# Patient Record
Sex: Male | Born: 1995 | Race: White | Hispanic: No | Marital: Single | Smoking: Current some day smoker
Health system: Southern US, Community
[De-identification: ages and names within clinical notes are randomized; demographics above are authoritative.]

## PROBLEM LIST (undated history)

## (undated) DIAGNOSIS — N5089 Other specified disorders of the male genital organs: Secondary | ICD-10-CM

## (undated) DIAGNOSIS — D72819 Decreased white blood cell count, unspecified: Secondary | ICD-10-CM

## (undated) HISTORY — DX: Other specified disorders of the male genital organs: N50.89

## (undated) HISTORY — DX: Decreased white blood cell count, unspecified: D72.819

---
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
## 2016-06-16 ENCOUNTER — Encounter: Payer: Self-pay | Admitting: Emergency Medicine

## 2016-06-16 ENCOUNTER — Ambulatory Visit
Admission: EM | Admit: 2016-06-16 | Discharge: 2016-06-16 | Disposition: A | Discharge status: Home / Self Care | Payer: Federal, State, Local not specified - PPO | Attending: Family Medicine | Admitting: Family Medicine

## 2016-06-16 DIAGNOSIS — J111 Influenza due to unidentified influenza virus with other respiratory manifestations: Secondary | ICD-10-CM | POA: Diagnosis not present

## 2016-06-16 MED ORDER — HYDROCOD POLST-CPM POLST ER 10-8 MG/5ML PO SUER: 5.0000 mL | Freq: Every evening | ORAL | 0 refills | Status: DC | PRN

## 2016-06-16 MED ORDER — ONDANSETRON HCL 8 MG PO TABS: 8.0000 mg | ORAL_TABLET | Freq: Three times a day (TID) | ORAL | 0 refills | Status: DC | PRN

## 2016-06-16 NOTE — Discharge Instructions (Signed)
Recommend take Zofran every 8 hours as needed for nausea. Increase fluid intake to help maintain hydration. Continue Ibuprofen 600mg  every 6 hours as needed for fever and body aches. May take Tussionex 1 teaspoon at night to help with cough and sleep. Rest. Follow-up with a primary care provider in 3 days if not improving or go to ER if symptoms worsen.

## 2016-06-16 NOTE — ED Provider Notes (Signed)
CSN: [id number]     Arrival date & time 06/16/16  1010 History   First MD Initiated Contact with Patient 06/16/16 1059     Chief Complaint  Patient presents with  . Influenza   (Consider location/radiation/quality/duration/timing/severity/associated sxs/prior Treatment) 21 year old male accompanied by a caregiver with fever, body aches, nasal congestion, cough, nausea and vomiting that started about 4 days ago. Also feeling dizzy and having difficulty keeping any fluids down due to nausea. Unable to sleep at night due to cough. Did have the flu vaccine. Has taken Ibuprofen, Theraflu, Dayquil, Nyquil with some relief. No other family members ill. No chronic health issues. Takes no daily medication.    The history is provided by the patient and a caregiver.    History reviewed. No pertinent past medical history. History reviewed. No pertinent surgical history. No family history on file. Social History  Substance Use Topics  . Smoking status: Never Smoker  . Smokeless tobacco: Never Used  . Alcohol use No    Review of Systems  Constitutional: Positive for appetite change, chills, fatigue and fever.  HENT: Positive for congestion, rhinorrhea and sore throat. Negative for ear pain, sinus pain and sinus pressure.   Eyes: Negative for discharge.  Respiratory: Positive for cough. Negative for chest tightness, shortness of breath and wheezing.   Cardiovascular: Negative for chest pain.  Gastrointestinal: Positive for nausea and vomiting. Negative for abdominal pain and diarrhea.  Musculoskeletal: Positive for arthralgias and myalgias. Negative for neck pain and neck stiffness.  Skin: Negative for rash.  Neurological: Positive for dizziness, light-headedness and headaches. Negative for syncope and weakness.  Hematological: Negative for adenopathy.    Allergies  Patient has no known allergies.  Home Medications   Prior to Admission medications   Medication Sig Start Date End Date  Taking? Authorizing Provider  chlorpheniramine-HYDROcodone (TUSSIONEX PENNKINETIC ER) 10-8 MG/5ML SUER Take 5 mLs by mouth at bedtime as needed for cough. 06/16/16   Sudie GrumblingAnn Berry Bodin Gorka, NP  ondansetron (ZOFRAN) 8 MG tablet Take 1 tablet (8 mg total) by mouth every 8 (eight) hours as needed for nausea or vomiting. 06/16/16   Sudie GrumblingAnn Berry Tykiera Raven, NP   Meds Ordered and Administered this Visit  Medications - No data to display  BP 124/68 (BP Location: Left Arm)   Pulse (!) 110   Temp (!) 100.7 F (38.2 C) (Oral)   Resp 16   Ht 5\' 11"  (1.803 m)   Wt 175 lb (79.4 kg)   SpO2 98%   BMI 24.41 kg/m  No data found.   Physical Exam  Constitutional: He is oriented to person, place, and time. He appears well-developed and well-nourished. He has a sickly appearance. No distress.  HENT:  Head: Normocephalic and atraumatic.  Right Ear: Hearing, tympanic membrane, external ear and ear canal normal.  Left Ear: Hearing, tympanic membrane, external ear and ear canal normal.  Nose: Mucosal edema and rhinorrhea present. Right sinus exhibits no maxillary sinus tenderness and no frontal sinus tenderness. Left sinus exhibits no maxillary sinus tenderness and no frontal sinus tenderness.  Mouth/Throat: Uvula is midline and mucous membranes are normal. Posterior oropharyngeal erythema present.  Neck: Normal range of motion. Neck supple.  Cardiovascular: Regular rhythm and normal heart sounds.  Tachycardia present.   Pulmonary/Chest: Effort normal and breath sounds normal. No respiratory distress. He has no decreased breath sounds. He has no wheezes. He has no rhonchi.  Lymphadenopathy:    He has no cervical adenopathy.  Neurological: He is alert  and oriented to person, place, and time.  Skin: Skin is warm. Capillary refill takes less than 2 seconds. He is diaphoretic.  Psychiatric: He has a normal mood and affect. His behavior is normal. Judgment and thought content normal.    Urgent Care Course     Procedures  (including critical care time)  Labs Review Labs Reviewed - No data to display  Imaging Review No results found.   Visual Acuity Review  Right Eye Distance:   Left Eye Distance:   Bilateral Distance:    Right Eye Near:   Left Eye Near:    Bilateral Near:         MDM   1. Influenza    Discussed with patient and his caregiver that he most likely has influenza. Discussed Tamiflu and patient declined Rx. Recommend take Zofran every 8 hours as needed for nausea. Increase fluid intake to help maintain hydration. Continue Ibuprofen 600mg  every 6 hours as needed for fever and body aches. May take Tussionex 1 teaspoon at night to help with cough and sleep. [place] Controlled Substance Registry Reviewed- no active Rx in the past 3 months. Rest. Note written for school. Follow-up with a primary care provider in 3 days if not improving or go to ER if symptoms worsen.      Sudie Grumbling, NP 06/16/16 2019

## 2016-06-16 NOTE — ED Triage Notes (Signed)
Fly symptoms for 4 days. Fever, chills, cough, body aches and vomiting.

## 2020-03-01 ENCOUNTER — Other Ambulatory Visit: Payer: Self-pay | Admitting: Gerontology

## 2020-03-01 ENCOUNTER — Other Ambulatory Visit (HOSPITAL_COMMUNITY): Payer: Self-pay | Admitting: Gerontology

## 2020-03-01 DIAGNOSIS — N5089 Other specified disorders of the male genital organs: Secondary | ICD-10-CM

## 2020-03-09 ENCOUNTER — Other Ambulatory Visit: Payer: Self-pay

## 2020-03-09 ENCOUNTER — Encounter (INDEPENDENT_AMBULATORY_CARE_PROVIDER_SITE_OTHER): Payer: Self-pay

## 2020-03-09 ENCOUNTER — Ambulatory Visit
Admission: RE | Admit: 2020-03-09 | Discharge: 2020-03-09 | Disposition: A | Discharge status: Home / Self Care | Payer: Federal, State, Local not specified - PPO | Source: Ambulatory Visit | Attending: Gerontology | Admitting: Gerontology

## 2020-03-09 DIAGNOSIS — N5089 Other specified disorders of the male genital organs: Secondary | ICD-10-CM | POA: Diagnosis not present

## 2021-06-29 ENCOUNTER — Encounter: Payer: Self-pay | Admitting: *Deleted

## 2021-07-04 ENCOUNTER — Telehealth: Payer: Self-pay

## 2021-07-04 NOTE — Telephone Encounter (Signed)
Called and left vm with patient in reference to NP appointment for 32/2023 at 9:30 am. Advised to call our office if any questions he may have prior to appt.  ?

## 2021-07-05 ENCOUNTER — Encounter: Payer: Self-pay | Admitting: Oncology

## 2021-07-05 ENCOUNTER — Inpatient Hospital Stay: Payer: Federal, State, Local not specified - PPO

## 2021-07-05 ENCOUNTER — Inpatient Hospital Stay: Payer: Federal, State, Local not specified - PPO | Attending: Oncology | Admitting: Oncology

## 2021-07-05 ENCOUNTER — Other Ambulatory Visit: Payer: Self-pay

## 2021-07-05 VITALS — BP 143/95 | HR 90 | Temp 96.5°F | Ht 70.0 in | Wt 202.0 lb

## 2021-07-05 DIAGNOSIS — F17210 Nicotine dependence, cigarettes, uncomplicated: Secondary | ICD-10-CM | POA: Insufficient documentation

## 2021-07-05 DIAGNOSIS — D72818 Other decreased white blood cell count: Secondary | ICD-10-CM

## 2021-07-05 DIAGNOSIS — D72819 Decreased white blood cell count, unspecified: Secondary | ICD-10-CM | POA: Insufficient documentation

## 2021-07-05 LAB — CBC WITH DIFFERENTIAL/PLATELET
Abs Immature Granulocytes: 0 10*3/uL (ref 0.00–0.07)
Basophils Absolute: 0 10*3/uL (ref 0.0–0.1)
Basophils Relative: 1 %
Eosinophils Absolute: 0 10*3/uL (ref 0.0–0.5)
Eosinophils Relative: 1 %
HCT: 43.8 % (ref 39.0–52.0)
Hemoglobin: 15.4 g/dL (ref 13.0–17.0)
Immature Granulocytes: 0 %
Lymphocytes Relative: 31 %
Lymphs Abs: 0.8 10*3/uL (ref 0.7–4.0)
MCH: 32.4 pg (ref 26.0–34.0)
MCHC: 35.2 g/dL (ref 30.0–36.0)
MCV: 92 fL (ref 80.0–100.0)
Monocytes Absolute: 0.2 10*3/uL (ref 0.1–1.0)
Monocytes Relative: 8 %
Neutro Abs: 1.5 10*3/uL — ABNORMAL LOW (ref 1.7–7.7)
Neutrophils Relative %: 59 %
Platelets: 236 10*3/uL (ref 150–400)
RBC: 4.76 MIL/uL (ref 4.22–5.81)
RDW: 11.1 % — ABNORMAL LOW (ref 11.5–15.5)
WBC: 2.5 10*3/uL — ABNORMAL LOW (ref 4.0–10.5)
nRBC: 0 % (ref 0.0–0.2)

## 2021-07-05 LAB — FOLATE: Folate: 12.7 ng/mL (ref >5.9)

## 2021-07-05 LAB — HEPATITIS PANEL, ACUTE
HCV Ab: NONREACTIVE
Hep A IgM: NONREACTIVE
Hep B C IgM: NONREACTIVE
Hepatitis B Surface Ag: NONREACTIVE

## 2021-07-05 LAB — TECHNOLOGIST SMEAR REVIEW
RBC MORPHOLOGY: NORMAL
Tech Review: NORMAL
WBC MORPHOLOGY: NORMAL

## 2021-07-05 LAB — LACTATE DEHYDROGENASE: LDH: 135 U/L (ref 98–192)

## 2021-07-05 LAB — VITAMIN B12: Vitamin B-12: 190 pg/mL (ref 180–914)

## 2021-07-05 NOTE — Progress Notes (Signed)
?Hematology/Oncology Consult note ?[phone] [phone] ?  ? ?   ? ? ?Patient Care Team: ?Sofie Hartigan, MD as PCP - General (Family Medicine) ?Earlie Server, MD as Consulting Physician (Hematology and Oncology) ? ?REFERRING PROVIDER: ?Latanya Maudlin, NP  ?CHIEF COMPLAINTS/REASON FOR VISIT:  ?Evaluation of leukopenia ? ?HISTORY OF PRESENTING ILLNESS:  ? ?Nicholas Arias is a  26 y.o.  male with PMH listed below was seen in consultation at the request of  Latanya Maudlin, NP  for evaluation of leukopenia ? ?06/20/2021, CBC showed a total white count of 3.3.  Normal differential.  Normal hemoglobin 15.5, normal platelet counts. ?Reviewed his previous blood work through care everywhere ?He had CBC done a year ago which showed white count of 3.4.  Normal differential. ?No other labs available in current EMR. ?Denies weight loss, fever, chills, fatigue, night sweats.   ?He drinks alcohol occasionally.  Does not take any medication or over-the-counter supplements. ? ?Review of Systems  ?Constitutional:  Negative for appetite change, chills, fatigue, fever and unexpected weight change.  ?HENT:   Negative for hearing loss and voice change.   ?Eyes:  Negative for eye problems and icterus.  ?Respiratory:  Negative for chest tightness, cough and shortness of breath.   ?Cardiovascular:  Negative for chest pain and leg swelling.  ?Gastrointestinal:  Negative for abdominal distention and abdominal pain.  ?Endocrine: Negative for hot flashes.  ?Genitourinary:  Negative for difficulty urinating, dysuria and frequency.   ?Musculoskeletal:  Negative for arthralgias.  ?Skin:  Negative for itching and rash.  ?Neurological:  Negative for light-headedness and numbness.  ?Hematological:  Negative for adenopathy. Does not bruise/bleed easily.  ?Psychiatric/Behavioral:  Negative for confusion.   ? ?MEDICAL HISTORY:  ?Past Medical History:  ?Diagnosis Date  ? Labral tear of shoulder, right, initial encounter  12/05/2014  ? Leukopenia   ? 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 Testicular mass   ? Tight posterior capsule of right shoulder 12/05/2014  ? Traumatic rupture of right ulnar collateral ligament 12/05/2014  ? ? ?SURGICAL HISTORY: ?No past surgical history on file. ? ?SOCIAL HISTORY: ?Social History  ? ?Socioeconomic History  ? Marital status: Single  ?  Spouse name: Not on file  ? Number of children: Not on file  ? Years of education: Not on file  ? Highest education level: Not on file  ?Occupational History  ? Not on file  ?Tobacco Use  ? Smoking status: Some Days  ?  Types: Cigarettes  ? Smokeless tobacco: Never  ?Vaping Use  ? Vaping Use: Never used  ?Substance and Sexual Activity  ? Alcohol use: Yes  ?  Alcohol/week: 3.0 - 4.0 standard drinks  ?  Types: 3 - 4 Cans of beer per week  ?  Comment: 3-4 beer on weekends  ? Drug use: Never  ? Sexual activity: Yes  ?Other Topics Concern  ? Not on file  ?Social History Narrative  ? Not on file  ? ?Social Determinants of Health  ? ?Financial Resource Strain: Not on file  ?Food Insecurity: Not on file  ?Transportation Needs: Not on file  ?Physical Activity: Not on file  ?Stress: Not on file  ?Social Connections: Not on file  ?Intimate Partner Violence: Not on file  ? ? ?FAMILY HISTORY: ?Family History  ?Problem Relation Age of Onset  ? Heart attack Maternal Grandmother   ? Heart attack Paternal Grandfather   ? ? ?ALLERGIES:  has No Known Allergies. ? ?MEDICATIONS:  ?No current  outpatient medications on file.  ? ?No current facility-administered medications for this visit.  ? ? ? ?PHYSICAL EXAMINATION: ?ECOG PERFORMANCE STATUS: 0 - Asymptomatic ?Vitals:  ? 07/05/21 0927  ?BP: (!) 143/95  ?Pulse: 90  ?Temp: (!) 96.5 ?F (35.8 ?C)  ? ?Filed Weights  ? 07/05/21 0927  ?Weight: 202 lb (91.6 kg)  ? ? ?Physical Exam ?Constitutional:   ?   General: He is not in acute distress. ?HENT:  ?   Head: Normocephalic and atraumatic.  ?Eyes:  ?   General: No scleral icterus. ?Cardiovascular:  ?    Rate and Rhythm: Normal rate and regular rhythm.  ?   Heart sounds: Normal heart sounds.  ?Pulmonary:  ?   Effort: Pulmonary effort is normal. No respiratory distress.  ?   Breath sounds: No wheezing.  ?Abdominal:  ?   General: Bowel sounds are normal. There is no distension.  ?   Palpations: Abdomen is soft.  ?Musculoskeletal:     ?   General: No deformity. Normal range of motion.  ?   Cervical back: Normal range of motion and neck supple.  ?Skin: ?   General: Skin is warm and dry.  ?   Findings: No erythema or rash.  ?Neurological:  ?   Mental Status: He is alert and oriented to person, place, and time. Mental status is at baseline.  ?   Cranial Nerves: No cranial nerve deficit.  ?   Coordination: Coordination normal.  ?Psychiatric:     ?   Mood and Affect: Mood normal.  ? ? ?LABORATORY DATA:  ?I have reviewed the data as listed ?Lab Results  ?Component Value Date  ? WBC 2.5 (L) 07/05/2021  ? HGB 15.4 07/05/2021  ? HCT 43.8 07/05/2021  ? MCV 92.0 07/05/2021  ? PLT 236 07/05/2021  ? ?No results for input(s): NA, K, CL, CO2, GLUCOSE, BUN, CREATININE, CALCIUM, GFRNONAA, GFRAA, PROT, ALBUMIN, AST, ALT, ALKPHOS, BILITOT, BILIDIR, IBILI in the last 8760 hours. ?Iron/TIBC/Ferritin/ %Sat ?No results found for: IRON, TIBC, FERRITIN, IRONPCTSAT  ? ? ?RADIOGRAPHIC STUDIES: ?I have personally reviewed the radiological images as listed and agreed with the findings in the report. ?No results found. ? ? ? ?ASSESSMENT & PLAN:  ?1. Other decreased white blood cell (WBC) count   ? ?#Chronic leukopenia, mild.  Patient is asymptomatic.  No frequent infections. ?Check CBC, smear, LDH, hepatitis panel, HIV, flow cytometry, SPEP, light chain, B12, folate. ? ?Orders Placed This Encounter  ?Procedures  ? Folate  ?  Standing Status:   Future  ?  Number of Occurrences:   1  ?  Standing Expiration Date:   07/06/2022  ? Vitamin B12  ?  Standing Status:   Future  ?  Number of Occurrences:   1  ?  Standing Expiration Date:   07/06/2022  ?  Technologist smear review  ?  Standing Status:   Future  ?  Number of Occurrences:   1  ?  Standing Expiration Date:   07/06/2022  ? CBC with Differential/Platelet  ?  Standing Status:   Future  ?  Number of Occurrences:   1  ?  Standing Expiration Date:   07/06/2022  ? Multiple Myeloma Panel (SPEP&IFE w/QIG)  ?  Standing Status:   Future  ?  Number of Occurrences:   1  ?  Standing Expiration Date:   07/06/2022  ? Kappa/lambda light chains  ?  Standing Status:   Future  ?  Number of Occurrences:  1  ?  Standing Expiration Date:   07/06/2022  ? Flow cytometry panel-leukemia/lymphoma work-up  ?  Standing Status:   Future  ?  Number of Occurrences:   1  ?  Standing Expiration Date:   07/06/2022  ? HIV Antibody (routine testing w rflx)  ?  Standing Status:   Future  ?  Number of Occurrences:   1  ?  Standing Expiration Date:   07/06/2022  ? Hepatitis panel, acute  ?  Standing Status:   Future  ?  Number of Occurrences:   1  ?  Standing Expiration Date:   07/06/2022  ? Lactate dehydrogenase  ?  Standing Status:   Future  ?  Number of Occurrences:   1  ?  Standing Expiration Date:   07/06/2022  ?  ?All questions were answered. The patient knows to call the clinic with any problems questions or concerns. ? ?cc ?Latanya Maudlin, NP  ? ? ?Return of visit: TBD ?Thank you for this kind referral and the opportunity to participate in the care of this patient. A copy of today's note is routed to referring provider  ? ?Earlie Server, MD, PhD ?[facility] Hematology Oncology ?07/05/2021 ? ? ?

## 2021-07-06 LAB — KAPPA/LAMBDA LIGHT CHAINS
Kappa free light chain: 13.3 mg/L (ref 3.3–19.4)
Kappa, lambda light chain ratio: 1.39 (ref 0.26–1.65)
Lambda free light chains: 9.6 mg/L (ref 5.7–26.3)

## 2021-07-07 LAB — HIV ANTIBODY (ROUTINE TESTING W REFLEX): HIV Screen 4th Generation wRfx: NONREACTIVE

## 2021-07-09 LAB — MULTIPLE MYELOMA PANEL, SERUM
Albumin SerPl Elph-Mcnc: 4.5 g/dL — ABNORMAL HIGH (ref 2.9–4.4)
Albumin/Glob SerPl: 1.6 (ref 0.7–1.7)
Alpha 1: 0.2 g/dL (ref 0.0–0.4)
Alpha2 Glob SerPl Elph-Mcnc: 0.6 g/dL (ref 0.4–1.0)
B-Globulin SerPl Elph-Mcnc: 1 g/dL (ref 0.7–1.3)
Gamma Glob SerPl Elph-Mcnc: 1.1 g/dL (ref 0.4–1.8)
Globulin, Total: 2.9 g/dL (ref 2.2–3.9)
IgA: 274 mg/dL (ref 90–386)
IgG (Immunoglobin G), Serum: 975 mg/dL (ref 603–1613)
IgM (Immunoglobulin M), Srm: 95 mg/dL (ref 20–172)
Total Protein ELP: 7.4 g/dL (ref 6.0–8.5)

## 2021-07-09 LAB — COMP PANEL: LEUKEMIA/LYMPHOMA

## 2021-07-10 ENCOUNTER — Telehealth: Payer: Self-pay | Admitting: *Deleted

## 2021-07-10 ENCOUNTER — Other Ambulatory Visit: Payer: Self-pay

## 2021-07-10 DIAGNOSIS — D72818 Other decreased white blood cell count: Secondary | ICD-10-CM

## 2021-07-10 NOTE — Telephone Encounter (Signed)
Patient called asking about lab results we went over results and I told him someone would call him back told him know if and when he needs to return. He has no follow up appointment as physician was TBD at last visit   Multiple Myeloma Panel (SPEP&IFE w/QIG) Order: [id number] Status: Edited Result - FINAL    Visible to patient: Yes (seen)    Next appt: None    Dx: Other decreased white blood cell (WBC...    0 Result Notes     Component Ref Range & Units 5 d ago  IgG (Immunoglobin G), Serum 603 - 1,613 mg/dL 975   IgA 90 - 386 mg/dL 274   IgM (Immunoglobulin M), Srm 20 - 172 mg/dL 95   Total Protein ELP 6.0 - 8.5 g/dL 7.4 VC   Albumin SerPl Elph-Mcnc 2.9 - 4.4 g/dL 4.5 High  VC   Alpha 1 0.0 - 0.4 g/dL 0.2 VC   Alpha2 Glob SerPl Elph-Mcnc 0.4 - 1.0 g/dL 0.6 VC   B-Globulin SerPl Elph-Mcnc 0.7 - 1.3 g/dL 1.0 VC   Gamma Glob SerPl Elph-Mcnc 0.4 - 1.8 g/dL 1.1 VC   M Protein SerPl Elph-Mcnc Not Observed g/dL Not Observed VC   Globulin, Total 2.2 - 3.9 g/dL 2.9 VC   Albumin/Glob SerPl 0.7 - 1.7 1.6 VC   IFE 1  Comment VC   Comment: (NOTE)  The immunofixation pattern appears unremarkable. Evidence of  monoclonal protein is not apparent.   Please Note  Comment VC   Comment: (NOTE)  Protein electrophoresis scan will follow via computer, mail, or  courier delivery.  Performed At: [facility]  [place], [place] [id number]  Rush Farmer MD [id number]   Resulting Agency  [facility]         Specimen Collected: 07/05/21 09:56 Last Resulted: 07/09/21 14:36     View Encounter Conversation      VC=Value has a corrected status       Result Care Coordination   Patient Communication   Add Comments   Seen Back to Top       Other Results from 07/05/2021  Lactate dehydrogenase Order: [id number] Status: Final result    Visible to patient: Yes (seen)    Next appt: None    Dx: Other decreased white blood cell (WBC...    0 Result Notes     Component Ref  Range & Units 5 d ago  LDH 98 - 192 U/L 135   Comment: Performed at [facility], [address] [place], [place] [address]  Resulting Agency  [facility]         Specimen Collected: 07/05/21 09:56 Last Resulted: 07/05/21 10:21     View Encounter Conversation        Result Care Coordination   Patient Communication   Add Comments   Seen Back to Top         Hepatitis panel, acute Order: [id number] Status: Final result    Visible to patient: Yes (seen)    Next appt: None    Dx: Other decreased white blood cell (WBC...    0 Result Notes   1 HM Topic     Component Ref Range & Units 5 d ago  Hepatitis B Surface Ag NON REACTIVE NON REACTIVE   HCV Ab NON REACTIVE NON REACTIVE   Comment: (NOTE)  Nonreactive HCV antibody screen is consistent with no HCV infections,  unless recent infection is suspected or other evidence exists  to  indicate HCV infection.    Hep A IgM NON REACTIVE NON REACTIVE   Hep B C IgM NON REACTIVE NON REACTIVE   Comment: Performed at [facility], [place] [address], [place], [place] [address]  Resulting Agency  [facility]         Specimen Collected: 07/05/21 09:56 Last Resulted: 07/05/21 18:07     View Encounter Conversation        Result Care Coordination   Patient Communication   Add Comments   Seen Back to Top      Satisfied Health Maintenance Topics     Back to Top Hepatitis C Screening   Completed  Address Topic         HIV Antibody (routine testing w rflx) Order: [id number] Status: Final result    Visible to patient: Yes (seen)    Next appt: None    Dx: Other decreased white blood cell (WBC...    0 Result Notes   1 HM Topic     Component Ref Range & Units 5 d ago  HIV Screen 4th Generation wRfx Non Reactive Non Reactive   Comment: Performed at [facility], [place] [address] [place], [place] [address]  Resulting Agency  [facility]         Specimen Collected: 07/05/21 09:56 Last Resulted: 07/07/21  19:16    View Encounter Conversation        Result Care Coordination   Patient Communication   Add Comments   Seen Back to Top      Satisfied Health Maintenance Topics     Back to Top HIV Screening   Completed  Address Topic         Flow cytometry panel-leukemia/lymphoma work-up Order: [id number] Status: Edited Result - FINAL    Visible to patient: Yes (seen)    Next appt: None    Dx: Other decreased white blood cell (WBC...    0 Result Notes    Component 5 d ago  PATH INTERP XXX-IMP Comment   Comment: No significant immunophenotypic abnormality detected  CLINICAL INFO Comment VC   Comment: (NOTE)  Accompanying CBC dated 07/05/2021 shows: WBC count 2.5, Neu 1.5, Lym  0.8, Mon  0.2.   Specimen Type Comment   Comment: Peripheral blood  ASSESSMENT OF LEUKOCYTES Comment   Comment: (NOTE)  No monoclonal B cell population is detected. kappa:lambda ratio 1.6  There is no loss of, or aberrant expression of, the pan T cell  antigens to  suggest a neoplastic T cell process.  CD4:CD8 ratio 1.9  No circulating blasts are detected.  There is no immunophenotypic evidence of abnormal myeloid maturation.  Analysis of the leukocyte population shows: granulocytes 67%,  monocytes 5%,  lymphocytes 28%, blasts <0.1%, B cells 5%, T cells 20%, NK cells 3%.   % Viable Cells Comment VC   Comment: 94%  ANALYSIS AND GATING STRATEGY Comment   Comment: 8 color analysis with CD45/SSC gating  IMMUNOPHENOTYPING STUDY Comment   Comment: (NOTE)  CD2       Normal         CD3       Normal  CD4       Normal         CD5       Normal  CD7       Normal         CD8       Normal  CD10  Normal         CD11b     Normal  CD13      Normal         CD14      Normal  CD16      Normal         CD19      Normal  CD20      Normal         CD33      Normal  CD34      Normal         CD38      Normal  CD45      Normal         CD56      Normal  CD57      Normal         CD117     Normal  HLA-DR    Normal          KAPPA     Normal  LAMBDA    Normal         CD64      Normal   PATHOLOGIST NAME Comment   Comment: Dorita Sciara, M.D.  COMMENT: Comment VC   Comment: (NOTE)  Each antibody in this assay was utilized to assess for potential  abnormalities of studied cell populations or to characterize  identified abnormalities.  This test was developed and its performance characteristics  determined by [facility].  It has not been cleared or approved by the  U.S. Food and Drug Administration.  The FDA has determined that such clearance or approval is not  necessary. This test is used for clinical purposes.  It should not  be regarded as investigational or for research.  Performed At: [facility]  [address] [place], [place] [id number]  Maurine Simmering MDPhD [id number]  Performed At: [facility]  [address] [place], [place] [id number]  Maurine Simmering MDPhD [id number]   Resulting Agency [facility]         Specimen Collected: 07/05/21 09:56 Last Resulted: 07/09/21 12:36     View Encounter Conversation      VC=Value has a corrected status       Result Care Coordination   Patient Communication   Add Comments   Seen Back to Top         Kappa/lambda light chains Order: [id number] Status: Final result    Visible to patient: Yes (seen)    Next appt: None    Dx: Other decreased white blood cell (WBC...    0 Result Notes     Component Ref Range & Units 5 d ago  Kappa free light chain 3.3 - 19.4 mg/L 13.3   Lambda free light chains 5.7 - 26.3 mg/L 9.6   Kappa, lambda light chain ratio 0.26 - 1.65 1.39   Comment: (NOTE)  Performed At: [facility]  [address] [place], [place] [id number]  Jolene Schimke MD YA:[id number]   Resulting Agency  [facility]         Specimen Collected: 07/05/21 09:56 Last Resulted: 07/06/21 14:36     View Encounter Conversation        Result Care Coordination   Patient Communication   Add Comments   Seen Back to Top           Contains abnormal data CBC with Differential/Platelet Order: [id number] Status: Final result    Visible to patient: Yes (seen)  Next appt: None    Dx: Other decreased white blood cell (WBC...    0 Result Notes     Component Ref Range & Units 5 d ago  WBC 4.0 - 10.5 K/uL 2.5 Low    RBC 4.22 - 5.81 MIL/uL 4.76   Hemoglobin 13.0 - 17.0 g/dL 15.4   HCT 39.0 - 52.0 % 43.8   MCV 80.0 - 100.0 fL 92.0   MCH 26.0 - 34.0 pg 32.4   MCHC 30.0 - 36.0 g/dL 35.2   RDW 11.5 - 15.5 % 11.1 Low    Platelets 150 - 400 K/uL 236   nRBC 0.0 - 0.2 % 0.0   Neutrophils Relative % % 59   Neutro Abs 1.7 - 7.7 K/uL 1.5 Low    Lymphocytes Relative % 31   Lymphs Abs 0.7 - 4.0 K/uL 0.8   Monocytes Relative % 8   Monocytes Absolute 0.1 - 1.0 K/uL 0.2   Eosinophils Relative % 1   Eosinophils Absolute 0.0 - 0.5 K/uL 0.0   Basophils Relative % 1   Basophils Absolute 0.0 - 0.1 K/uL 0.0   Immature Granulocytes % 0   Abs Immature Granulocytes 0.00 - 0.07 K/uL 0.00   Comment: Performed at [facility], [address] [place], [place] [address]  Resulting Agency  [facility]         Specimen Collected: 07/05/21 09:56 Last Resulted: 07/05/21 10:12    View Encounter Conversation        Result Care Coordination   Patient Communication   Add Comments   Seen Back to Top         Vitamin B12 Order: [id number] Status: Final result    Visible to patient: Yes (seen)    Next appt: None    Dx: Other decreased white blood cell (WBC...    0 Result Notes     Component Ref Range & Units 5 d ago  Vitamin B-12 180 - 914 pg/mL 190   Comment: (NOTE)  This assay is not validated for testing neonatal or  myeloproliferative syndrome specimens for Vitamin B12 levels.  Performed at [facility], [place] [address], [place], [place]  [address]   Resulting Agency  [facility] LAB         Specimen Collected: 07/05/21 09:56 Last Resulted: 07/05/21 16:21     View Encounter Conversation         Result Care Coordination   Patient Communication   Add Comments   Seen Back to Top         Folate Order: [id number] Status: Final result    Visible to patient: Yes (seen)    Next appt: None    Dx: Other decreased white blood cell (WBC...    0 Result Notes     Component Ref Range & Units 5 d ago  Folate >5.9 ng/mL 12.7   Comment: Performed at [facility], [address] [place], [place] [address]  Resulting Agency  [facility]         Specimen Collected: 07/05/21 09:56 Last Resulted: 07/05/21 11:55     View Encounter Conversation        Result Care Coordination   Patient Communication   Add Comments   Seen Back to Top         Technologist smear review Order: [id number] Status: Final result    Visible to patient: Yes (seen)    Next appt: None    Dx: Other decreased  white blood cell (WBC...    0 Result Notes    Component 5 d ago  WBC MORPHOLOGY Normal RBC, WBC, and platelet   RBC MORPHOLOGY Normal RBC, WBC, and platelet   Tech Review Normal RBC, WBC, and platelet   Comment: Performed at [facility], [address], [place], [place] [address]  Resulting Agency [facility]         Specimen Collected: 07/05/21 09:54 Last Resulted: 07/05/21 11:04

## 2021-07-10 NOTE — Telephone Encounter (Signed)
Patient informed and verbalized understanding. He will need to be scheduled for labs in 4 months and MD a few days after labs.  ? ?FYI- Pt said he will call back tomorrow to schedule follow up appts.  ?

## 2021-09-10 IMAGING — US US SCROTUM W/ DOPPLER COMPLETE
1 series · 13 of 25 positions shown · non-contrast
Comparison: None.

CLINICAL DATA: Left testicular mass for 2 months

EXAM:
SCROTAL ULTRASOUND
DOPPLER ULTRASOUND OF THE TESTICLES
TECHNIQUE: Complete ultrasound examination of the testicles, epididymis, and
other scrotal structures was performed. Color and spectral Doppler
ultrasound were also utilized to evaluate blood flow to the
testicles.

[Series 1: us scrotum w/ doppler complete · 0.07mm/px · 13 of 47 slices shown]
[im 1/47]
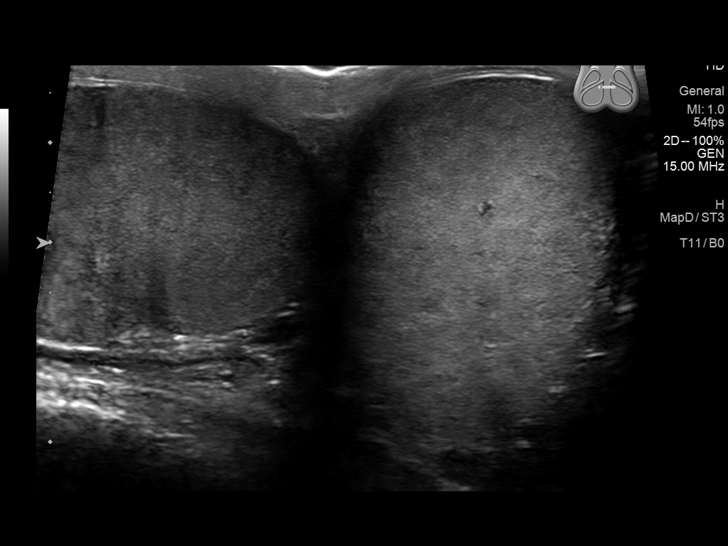
[im 4/47]
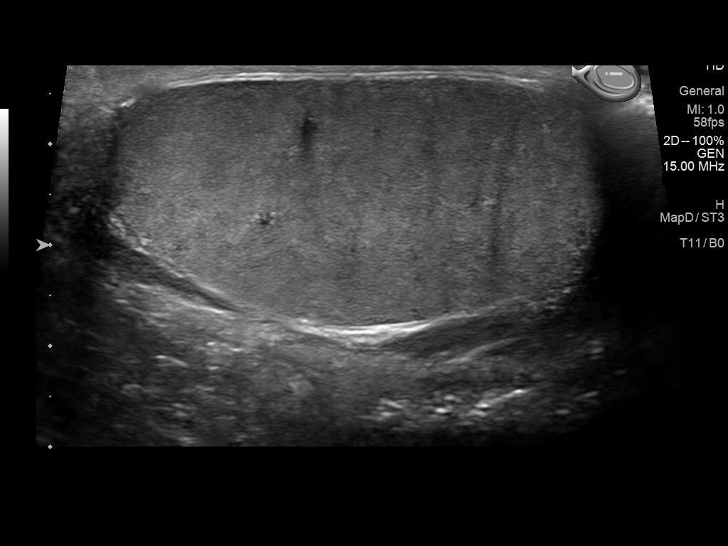
[im 8/47]
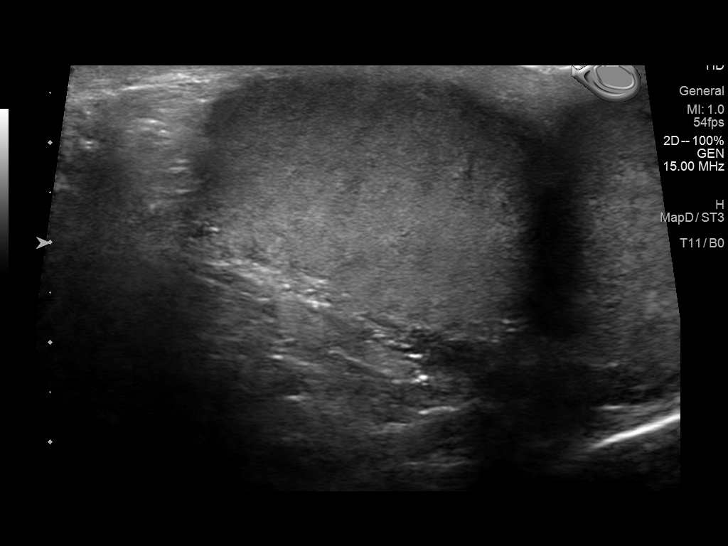
[im 12/47]
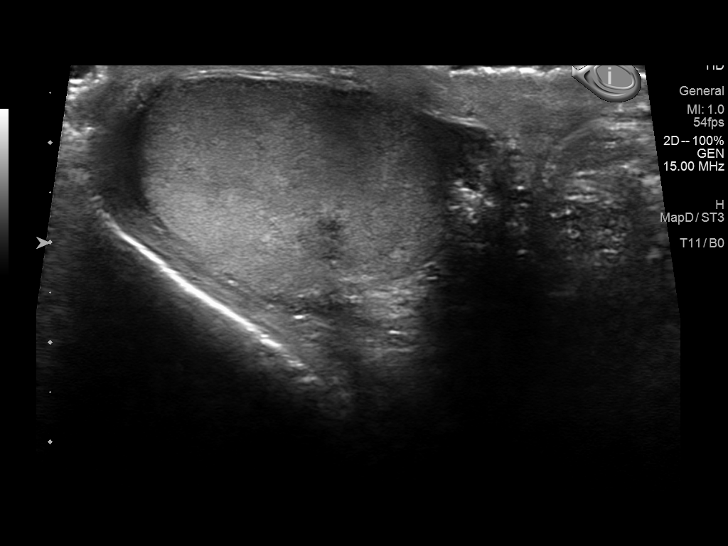
[im 16/47]
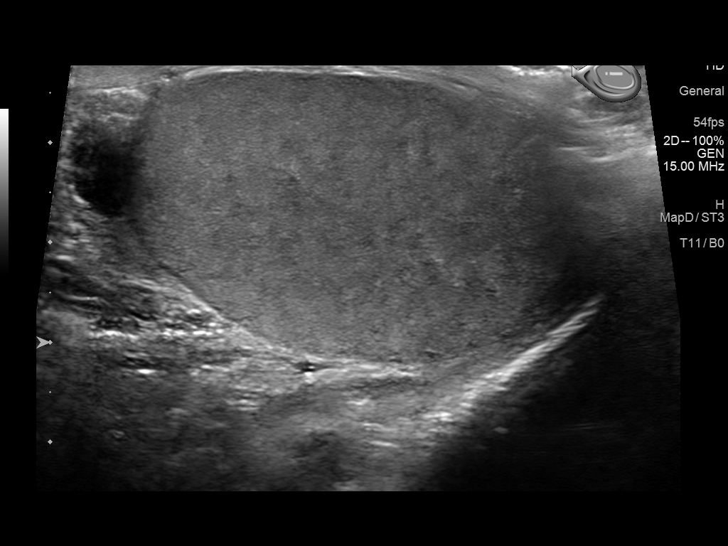
[im 20/47]
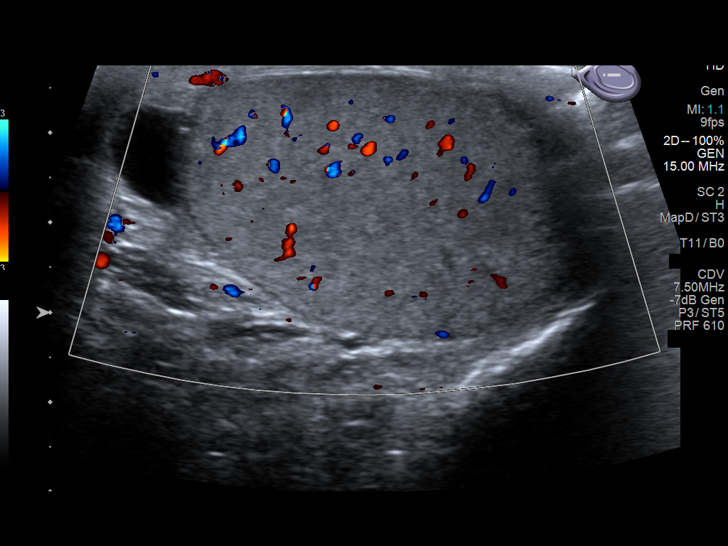
[im 24/47]
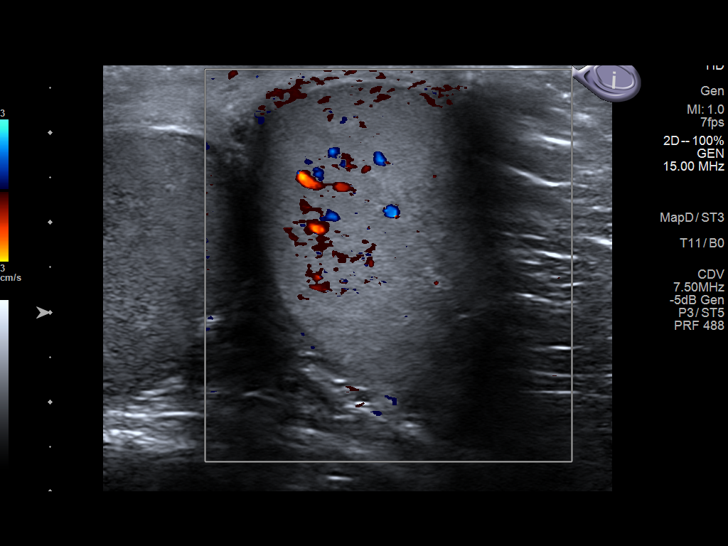
[im 27/47]
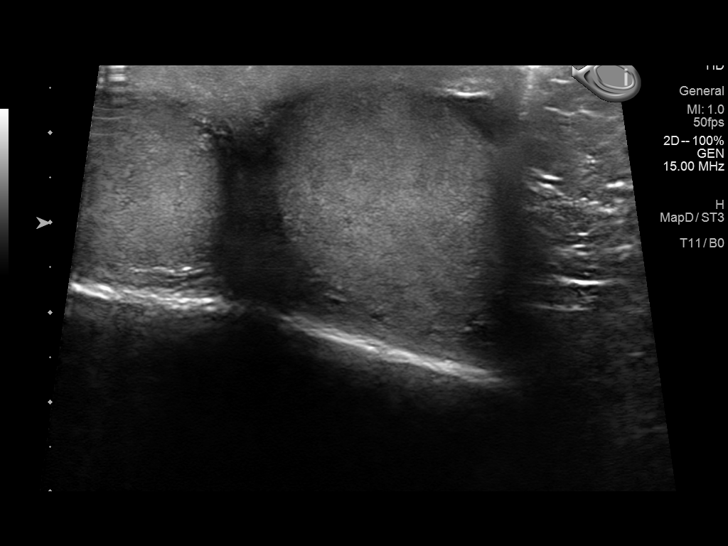
[im 31/47]
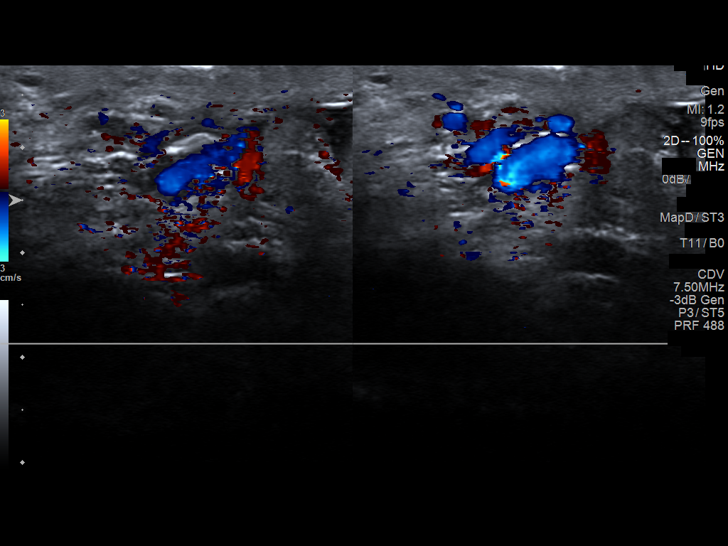
[im 35/47]
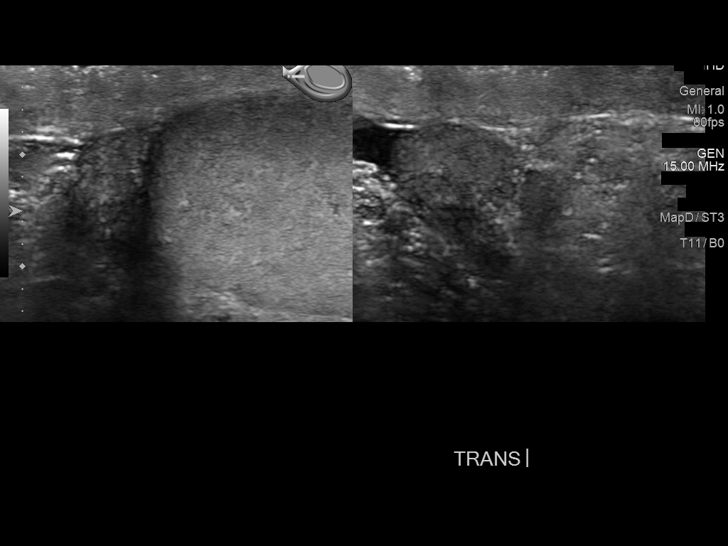
[im 39/47]
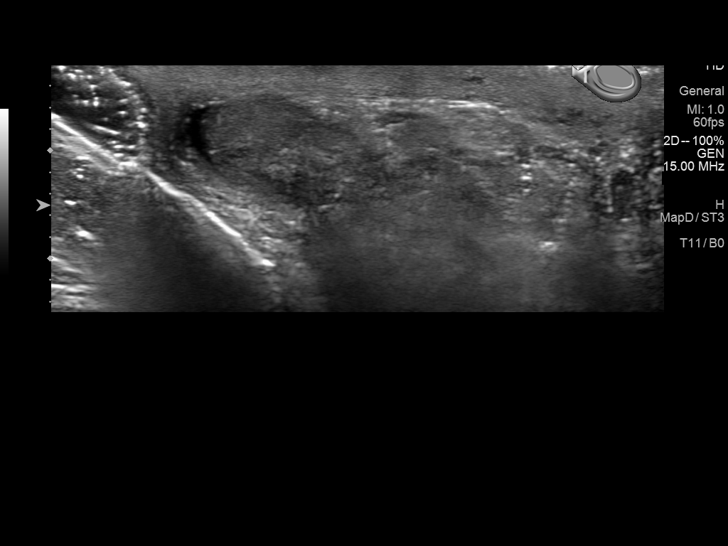
[im 43/47]
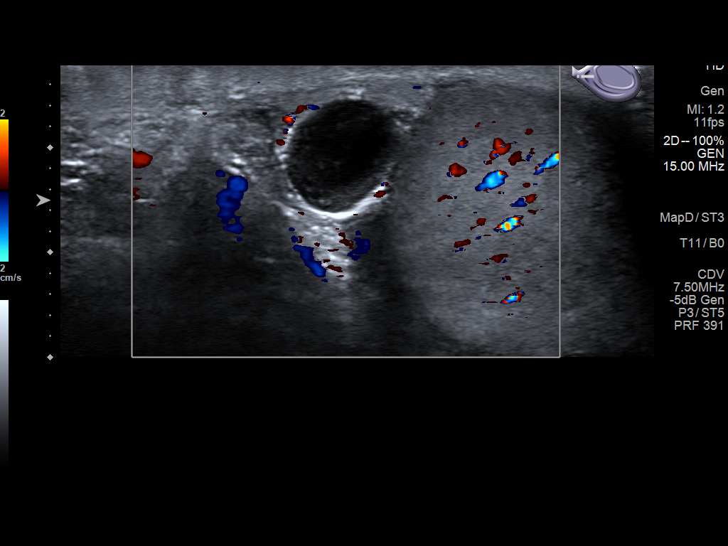
[im 47/47]
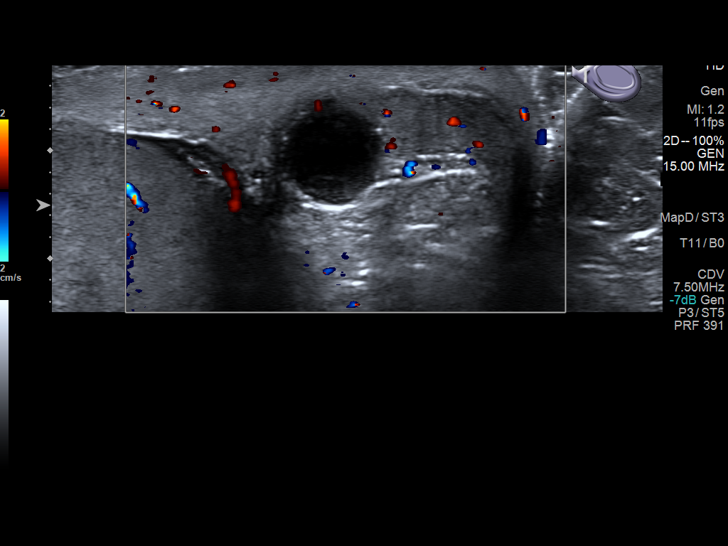

[13 of 25 positions shown; findings below may reference images not displayed]

FINDINGS: Right testicle

Measurements: 4.9 x 2.5 x 3.5 cm. Normal uniform parenchyma. No mass
or microlithiasis.

Left testicle

Measurements: 4.5 x 2.8 x 2.7 cm. Normal uniform parenchyma. No mass
or microlithiasis.

Right epididymis:  0.7 x 1.0 x 1.2 cm.  Normal size and appearance

Left epididymis: 1.5 x 1.2 x 2.2 cm with a small anechoic 1.2 x
x 1.2 cm cyst at the epididymal head without concerning sonographic
features, likely to correspond to the palpable abnormality.

Hydrocele:  None visualized.

Varicocele:  None visualized.

Pulsed Doppler interrogation of both testes demonstrates normal low
resistance arterial and venous waveforms bilaterally.
IMPRESSION: Palpable abnormality likely corresponds to a small 1.2 x 1.1 x
cm left epididymal head cyst, a typically benign incidental finding.

No concerning testicular mass or evidence of torsion.

## 2021-11-08 ENCOUNTER — Telehealth: Payer: Self-pay | Admitting: Oncology

## 2021-11-08 ENCOUNTER — Other Ambulatory Visit: Payer: Federal, State, Local not specified - PPO

## 2021-11-08 NOTE — Telephone Encounter (Signed)
Per pt VM to cancel appts, he will be out of town and will call to r/s .Marland KitchenKJ

## 2021-11-09 ENCOUNTER — Inpatient Hospital Stay: Payer: Federal, State, Local not specified - PPO | Attending: Oncology

## 2021-11-09 ENCOUNTER — Other Ambulatory Visit: Payer: Federal, State, Local not specified - PPO

## 2021-11-09 DIAGNOSIS — D72818 Other decreased white blood cell count: Secondary | ICD-10-CM | POA: Insufficient documentation

## 2021-11-09 LAB — CBC WITH DIFFERENTIAL/PLATELET
Abs Immature Granulocytes: 0.02 10*3/uL (ref 0.00–0.07)
Basophils Absolute: 0 10*3/uL (ref 0.0–0.1)
Basophils Relative: 1 %
Eosinophils Absolute: 0.1 10*3/uL (ref 0.0–0.5)
Eosinophils Relative: 1 %
HCT: 44.9 % (ref 39.0–52.0)
Hemoglobin: 15.4 g/dL (ref 13.0–17.0)
Immature Granulocytes: 0 %
Lymphocytes Relative: 39 %
Lymphs Abs: 1.8 10*3/uL (ref 0.7–4.0)
MCH: 31.7 pg (ref 26.0–34.0)
MCHC: 34.3 g/dL (ref 30.0–36.0)
MCV: 92.4 fL (ref 80.0–100.0)
Monocytes Absolute: 0.4 10*3/uL (ref 0.1–1.0)
Monocytes Relative: 9 %
Neutro Abs: 2.2 10*3/uL (ref 1.7–7.7)
Neutrophils Relative %: 50 %
Platelets: 249 10*3/uL (ref 150–400)
RBC: 4.86 MIL/uL (ref 4.22–5.81)
RDW: 11.1 % — ABNORMAL LOW (ref 11.5–15.5)
WBC: 4.5 10*3/uL (ref 4.0–10.5)
nRBC: 0 % (ref 0.0–0.2)

## 2021-11-09 LAB — VITAMIN B12: Vitamin B-12: 335 pg/mL (ref 180–914)

## 2021-11-13 ENCOUNTER — Ambulatory Visit: Payer: Federal, State, Local not specified - PPO | Admitting: Oncology
